# Patient Record
Sex: Male | Born: 1970 | Race: White | Hispanic: No | Marital: Single | Smoking: Never smoker
Health system: Southern US, Community
[De-identification: ages and names within clinical notes are randomized; demographics above are authoritative.]

## PROBLEM LIST (undated history)

## (undated) DIAGNOSIS — I10 Essential (primary) hypertension: Secondary | ICD-10-CM

---

## 2010-09-22 ENCOUNTER — Inpatient Hospital Stay: Payer: Self-pay | Admitting: Internal Medicine

## 2018-06-21 ENCOUNTER — Other Ambulatory Visit: Payer: Self-pay

## 2018-06-21 ENCOUNTER — Encounter: Payer: Self-pay | Admitting: Emergency Medicine

## 2018-06-21 ENCOUNTER — Emergency Department: Payer: No Typology Code available for payment source

## 2018-06-21 ENCOUNTER — Emergency Department
Admission: EM | Admit: 2018-06-21 | Discharge: 2018-06-21 | Disposition: A | Discharge status: Home / Self Care | Payer: No Typology Code available for payment source | Attending: Emergency Medicine | Admitting: Emergency Medicine

## 2018-06-21 DIAGNOSIS — L03211 Cellulitis of face: Secondary | ICD-10-CM | POA: Diagnosis not present

## 2018-06-21 DIAGNOSIS — H02843 Edema of right eye, unspecified eyelid: Secondary | ICD-10-CM | POA: Diagnosis present

## 2018-06-21 DIAGNOSIS — L03213 Periorbital cellulitis: Secondary | ICD-10-CM

## 2018-06-21 HISTORY — DX: Essential (primary) hypertension: I10

## 2018-06-21 LAB — CBC WITH DIFFERENTIAL/PLATELET
BASOS ABS: 0 10*3/uL (ref 0–0.1)
BASOS PCT: 1 %
EOS PCT: 2 %
Eosinophils Absolute: 0.1 10*3/uL (ref 0–0.7)
HCT: 38.8 % — ABNORMAL LOW (ref 40.0–52.0)
Hemoglobin: 13.9 g/dL (ref 13.0–18.0)
LYMPHS PCT: 28 %
Lymphs Abs: 0.8 10*3/uL — ABNORMAL LOW (ref 1.0–3.6)
MCH: 31.6 pg (ref 26.0–34.0)
MCHC: 35.7 g/dL (ref 32.0–36.0)
MCV: 88.3 fL (ref 80.0–100.0)
MONO ABS: 0.3 10*3/uL (ref 0.2–1.0)
Monocytes Relative: 11 %
Neutro Abs: 1.8 10*3/uL (ref 1.4–6.5)
Neutrophils Relative %: 58 %
Platelets: 159 10*3/uL (ref 150–440)
RBC: 4.39 MIL/uL — ABNORMAL LOW (ref 4.40–5.90)
RDW: 13.3 % (ref 11.5–14.5)
WBC: 3 10*3/uL — ABNORMAL LOW (ref 3.8–10.6)

## 2018-06-21 LAB — COMPREHENSIVE METABOLIC PANEL
ALBUMIN: 4 g/dL (ref 3.5–5.0)
ALK PHOS: 66 U/L (ref 38–126)
ALT: 99 U/L — AB (ref 0–44)
AST: 67 U/L — AB (ref 15–41)
Anion gap: 6 (ref 5–15)
BILIRUBIN TOTAL: 1 mg/dL (ref 0.3–1.2)
BUN: 15 mg/dL (ref 6–20)
CALCIUM: 9 mg/dL (ref 8.9–10.3)
CO2: 25 mmol/L (ref 22–32)
CREATININE: 0.78 mg/dL (ref 0.61–1.24)
Chloride: 105 mmol/L (ref 98–111)
GFR calc Af Amer: >60 mL/min (ref >60)
GFR calc non Af Amer: >60 mL/min (ref >60)
GLUCOSE: 356 mg/dL — AB (ref 70–99)
Potassium: 3.9 mmol/L (ref 3.5–5.1)
Sodium: 136 mmol/L (ref 135–145)
TOTAL PROTEIN: 7.3 g/dL (ref 6.5–8.1)

## 2018-06-21 MED ORDER — IOHEXOL 300 MG/ML  SOLN
75.0000 mL | Freq: Once | INTRAMUSCULAR | Status: AC | PRN
  Administered 2018-06-21: 75 mL via INTRAVENOUS

## 2018-06-21 MED ORDER — CEPHALEXIN 500 MG PO CAPS: 500.0000 mg | ORAL_CAPSULE | Freq: Four times a day (QID) | ORAL | 0 refills | Status: AC

## 2018-06-21 MED ORDER — SULFAMETHOXAZOLE-TRIMETHOPRIM 800-160 MG PO TABS: 1.0000 | ORAL_TABLET | Freq: Two times a day (BID) | ORAL | 0 refills | Status: AC

## 2018-06-21 MED ORDER — CEFTRIAXONE SODIUM 1 G IJ SOLR
1.0000 g | Freq: Once | INTRAMUSCULAR | Status: AC
  Administered 2018-06-21: 1 g via INTRAVENOUS
  Filled 2018-06-21: qty 10

## 2018-06-21 NOTE — ED Notes (Signed)
2nd blood culture set attempted x2, 2nd RN to attempt.

## 2018-06-21 NOTE — ED Notes (Signed)
2nd RN x2 unsuccessful attempts at 2nd blood culture. 3rd RN to attempt.

## 2018-06-21 NOTE — ED Triage Notes (Signed)
FIRST NURSE NOTE-sent from eye doctor for r/o periorbital cellulitis.

## 2018-06-21 NOTE — ED Provider Notes (Signed)
[facility] Emergency Department Provider Note ____________________________________________   I have reviewed the triage vital signs and the triage nursing note.  HISTORY  Chief Complaint Eye Problem   Historian Patient  HPI Alexander Martin. is a 47 y.o. male with a history of hypertension presents from the eye doctor, [facility] for evaluation of right periorbital swelling and redness and discomfort.  He rates the discomfort 2 out of 10.  Symptoms started on Thursday when he was working on a a car and would/metal dust came around his eye protection.  He had irritation over the night and saw the eye doctor on Friday.  It sounds like he was started on eye medication on Friday and saw the eye doctor again on Sunday when I became more so swollen especially the right eyelid, and then saw the eye doctor again this morning because the conjunctiva is now severely red as well as significant swelling of the face as well as the right periorbital area.  He shows me that he was put on erythromycin eye ointment as well as Tobra Dex.  He has also been on amoxicillin p.o. since Friday.     Past Medical History:  Diagnosis Date  . Hypertension     There are no active problems to display for this patient.   History reviewed. No pertinent surgical history.  Prior to Admission medications   Medication Sig Start Date End Date Taking? Authorizing Provider  cephALEXin (KEFLEX) 500 MG capsule Take 1 capsule (500 mg total) by mouth 4 (four) times daily for 10 days. 06/21/18 07/01/18  Governor Rooks, MD  sulfamethoxazole-trimethoprim (BACTRIM DS,SEPTRA DS) 800-160 MG tablet Take 1 tablet by mouth 2 (two) times daily. 06/21/18   Governor Rooks, MD    No Known Allergies  History reviewed. No pertinent family history.  Social History Social History   Tobacco Use  . Smoking status: Never Smoker  . Smokeless tobacco: Never Used  Substance Use Topics  . Alcohol use: Yes   Frequency: Never  . Drug use: Never    Review of Systems  Constitutional: Negative for fever. Eyes: Right periorbital redness and swelling, discomfort and some mild blurry vision in the right eye as per HPI. ENT: Negative for sore throat. Cardiovascular: Negative for chest pain. Respiratory: Negative for shortness of breath. Gastrointestinal: Negative for abdominal pain, vomiting and diarrhea. Genitourinary: Negative for dysuria. Musculoskeletal: Negative for back pain. Skin: Negative for rash, some redness to the right periorbital area. Neurological: Negative for headache.  ____________________________________________   PHYSICAL EXAM:  VITAL SIGNS: ED Triage Vitals  Enc Vitals Group     BP 06/21/18 1057 (!) 148/89     Pulse Rate 06/21/18 1057 81     Resp 06/21/18 1057 20     Temp 06/21/18 1057 98.1 F (36.7 C)     Temp Source 06/21/18 1057 Oral     SpO2 06/21/18 1057 96 %     Weight 06/21/18 1054 280 lb (127 kg)     Height 06/21/18 1054 5\' 9"  (1.753 m)     Head Circumference --      Peak Flow --      Pain Score 06/21/18 1054 2     Pain Loc --      Pain Edu? --      Excl. in GC? --      Constitutional: Alert and oriented.  HEENT      Head: Normocephalic and atraumatic.      Eyes: Pupils equal and  round.  Extraocular movements intact.  No pain with extraocular movement.  Conjunctiva bloody/red as well as swollen in the right eye.  Periorbital redness and swelling on the right extending into the face.      Ears:         Nose: No congestion/rhinnorhea.      Mouth/Throat: Mucous membranes are moist.      Neck: No stridor.  Lymph adenopathy on the right. Cardiovascular/Chest: Normal rate, regular rhythm.  No murmurs, rubs, or gallops. Respiratory: Normal respiratory effort without tachypnea nor retractions. Breath sounds are clear and equal bilaterally. No wheezes/rales/rhonchi. Gastrointestinal:   Genitourinary/rectal:Deferred Musculoskeletal: Nontender with normal  range of motion in all extremities.  Neurologic:  Normal speech and language. No gross or focal neurologic deficits are appreciated. Skin:  Skin is warm, dry and intact. No rash noted. Psychiatric: Mood and affect are normal. Speech and behavior are normal. Patient exhibits appropriate insight and judgment.   ____________________________________________  LABS (pertinent positives/negatives) I, Governor Rooks, MD the attending physician have reviewed the labs noted below.  Labs Reviewed  COMPREHENSIVE METABOLIC PANEL - Abnormal; Notable for the following components:      Result Value   Glucose, Bld 356 (*)    AST 67 (*)    ALT 99 (*)    All other components within normal limits  CBC WITH DIFFERENTIAL/PLATELET - Abnormal; Notable for the following components:   WBC 3.0 (*)    RBC 4.39 (*)    HCT 38.8 (*)    Lymphs Abs 0.8 (*)    All other components within normal limits  CULTURE, BLOOD (ROUTINE X 2)  CULTURE, BLOOD (ROUTINE X 2)    ____________________________________________    EKG I, Governor Rooks, MD, the attending physician have personally viewed and interpreted all ECGs.  None ____________________________________________  RADIOLOGY   CT orbits with contrast: Reviewed radiologist report: IMPRESSION: Preseptal cellulitis right orbit. No evidence of orbital cellulitis or abscess.  Bilateral periparotid nodules most likely lymph nodes. Mild cellulitis right lateral face. __________________________________________  PROCEDURES  Procedure(s) performed: None  Procedures  Critical Care performed: None   ____________________________________________  ED COURSE / ASSESSMENT AND PLAN  Pertinent labs & imaging results that were available during my care of the patient were reviewed by me and considered in my medical decision making (see chart for details).    Patient has swelling to his face concerning for cellulitis, question possibility of septal versus preseptal  cellulitis.  We will go ahead and check laboratory studies as well as CT of the orbits to ensure no findings of orbital cellulitis.  We will give a dose of IV Rocephin immediately.  Patient continues to be well-appearing.  Laboratory studies are reassuring.  White blood cell count is 3.0 and I discussed that he will need follow-up.  CT scan shows no evidence of orbital cellulitis.  He does not appear septic, I think is reasonable to try outpatient management with changing antibiotic coverage to Keflex and Bactrim.  We did have a discussion about very close follow-up.     CONSULTATIONS:   None  Patient / Family / Caregiver informed of clinical course, medical decision-making process, and agree with plan.   I discussed return precautions, follow-up instructions, and discharge instructions with patient and/or family.  Discharge Instructions : You are being treated for skin infection called cellulitis with antibiotics Keflex and Bactrim.  Return to the emergency room immediately for any worsening condition including new or worsening pain or swelling, fever, vomiting, altered mental  status or confusion, dizziness or passing out, or any other symptoms concerning to you.      ___________________________________________   FINAL CLINICAL IMPRESSION(S) / ED DIAGNOSES   Final diagnoses:  Facial cellulitis      ___________________________________________         Note: This dictation was prepared with Dragon dictation. Any transcriptional errors that result from this process are unintentional    Governor Rooks, MD 06/21/18 1356

## 2018-06-21 NOTE — Discharge Instructions (Addendum)
You are being treated for skin infection called cellulitis with antibiotics Keflex and Bactrim.  Discontinue Amoxicillin.  Continue your eyedrops.  Return to the emergency room immediately for any worsening condition including new or worsening pain or swelling, fever, vomiting, altered mental status or confusion, dizziness or passing out, or any other symptoms concerning to you.

## 2018-06-21 NOTE — ED Triage Notes (Addendum)
Sent from eye doctor for r/o periorbital cellulitis and CT scan.  Swelling to right periorbital area with vision changes.

## 2018-06-26 LAB — CULTURE, BLOOD (ROUTINE X 2)
CULTURE: NO GROWTH
Culture: NO GROWTH

## 2018-09-12 IMAGING — CT CT ORBITS W/ CM
3 of 4 series · 13 of 47 positions shown, 15 images · IV contrast (omnipaque)
Comparison: CT face 09/22/2010

CLINICAL DATA: Periorbital swelling right eye.  Cellulitis.

EXAM:
CT ORBITS WITH CONTRAST
TECHNIQUE: Multidetector CT images was performed according to the standard
protocol following intravenous contrast administration.
CONTRAST:  75mL OMNIPAQUE IOHEXOL 300 MG/ML  SOLN

[Series 3: orbits 2.0 h30s st · axial · 0.36mm/px · z∈[+270,+356]mm · 8 of 51 slices shown, 10 images]
[im 4/51  brain]
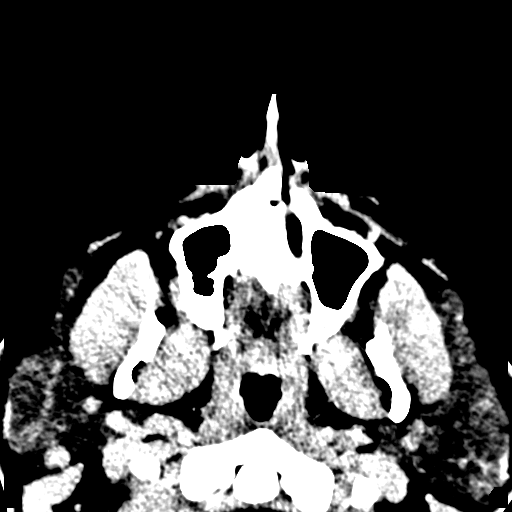
[im 4/51  bone]
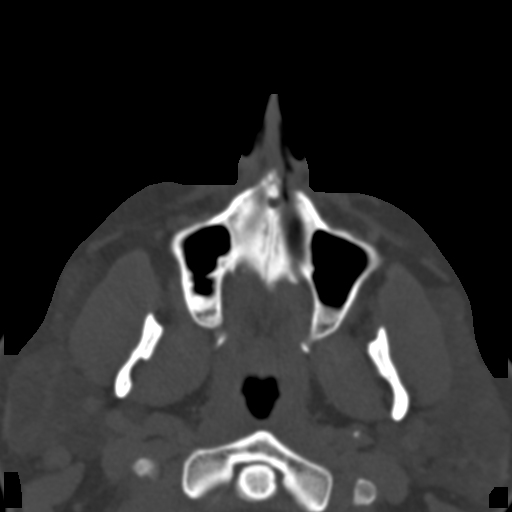
[im 11/51  bone]
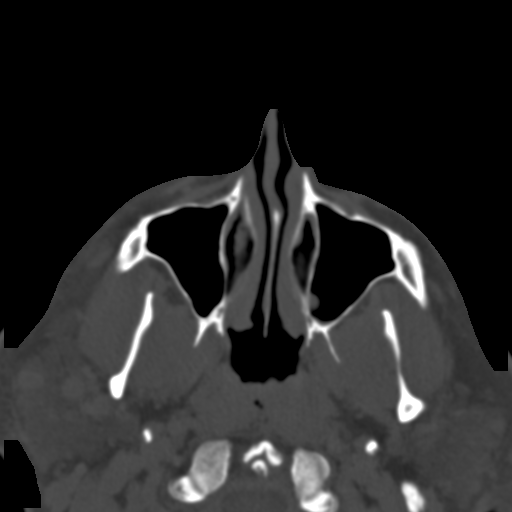
[im 16/51  bone]
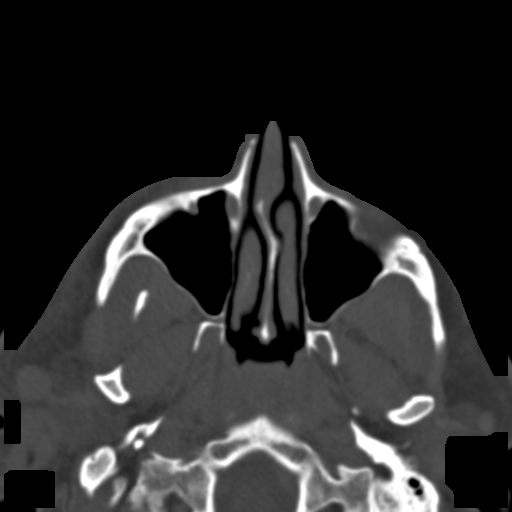
[im 23/51  bone]
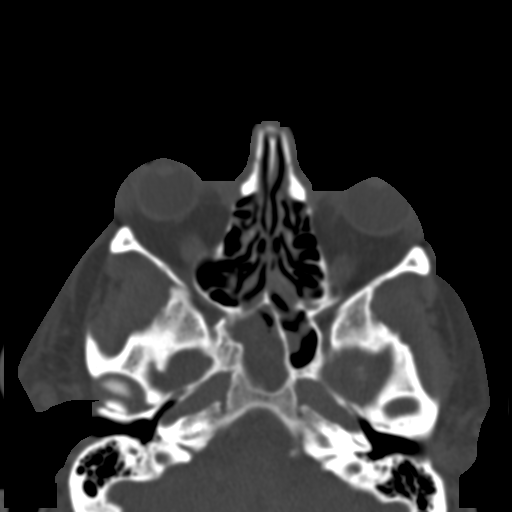
[im 28/51  brain]
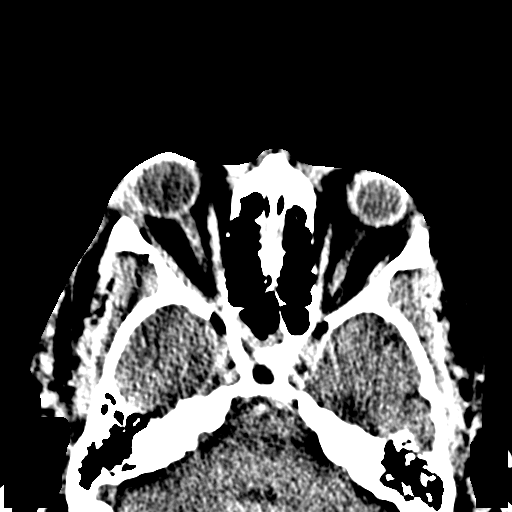
[im 28/51  bone]
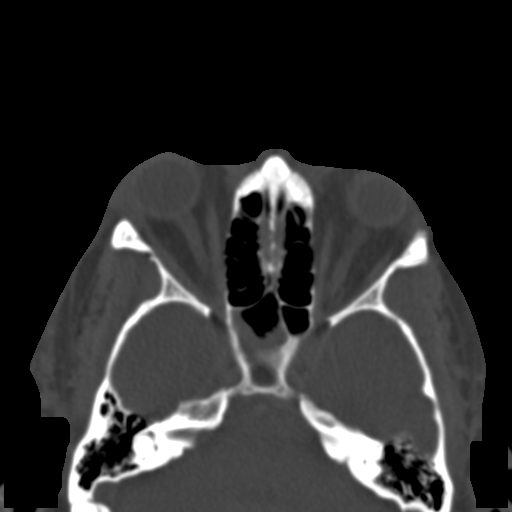
[im 35/51  bone]
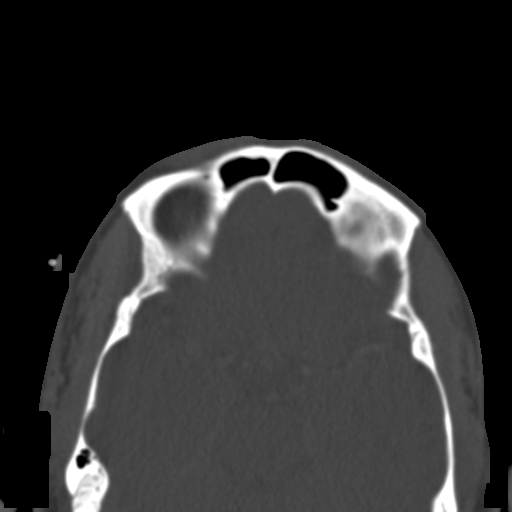
[im 40/51  bone]
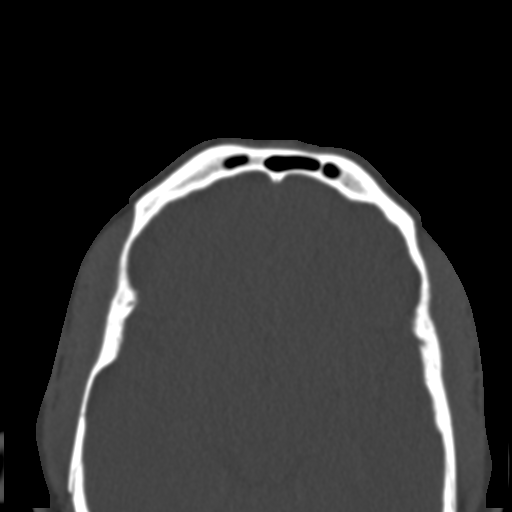
[im 47/51  bone]
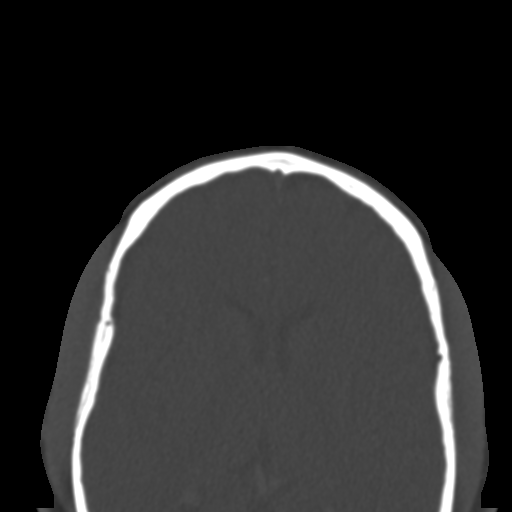

[Series 8: orbits 2.0 coronal · coronal · 0.21mm/px · 3 of 71 slices shown]
[im 24/71  bone]
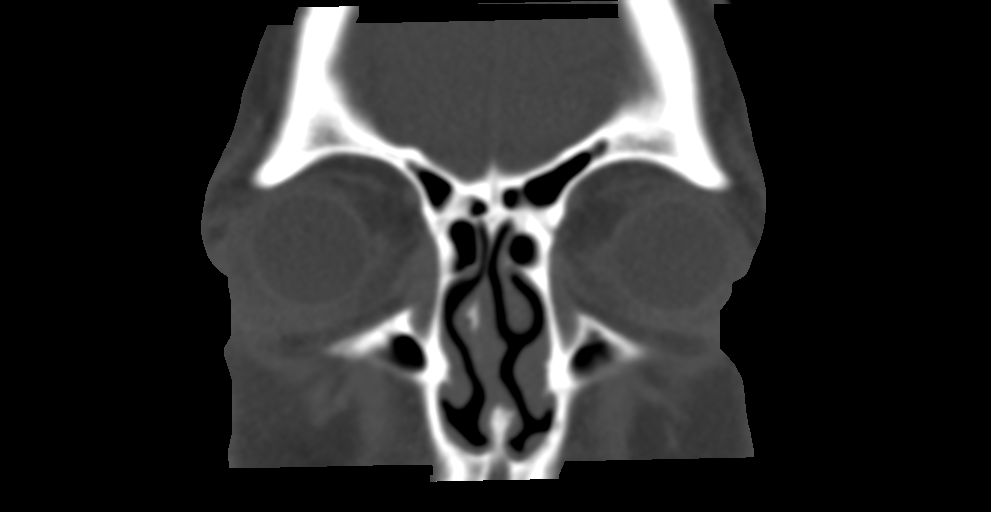
[im 32/71  bone]
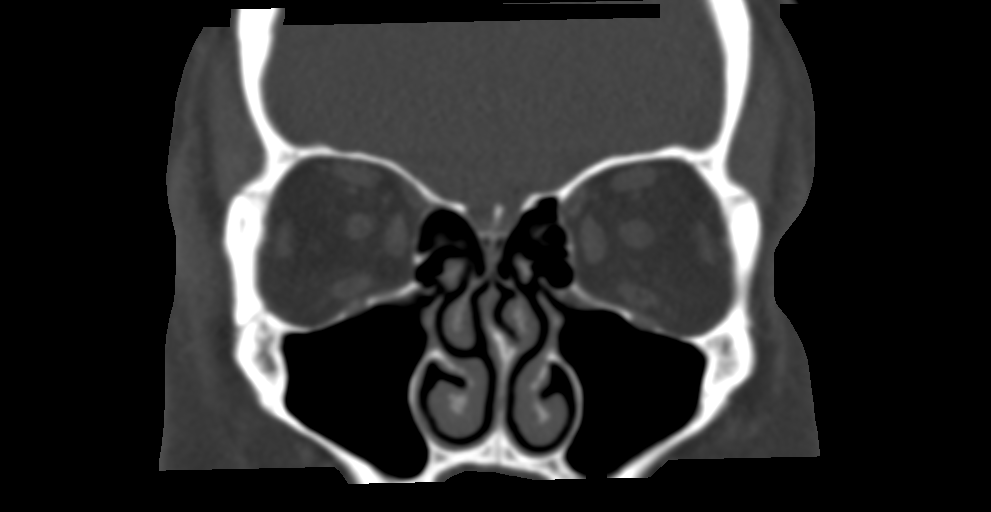
[im 39/71  bone]
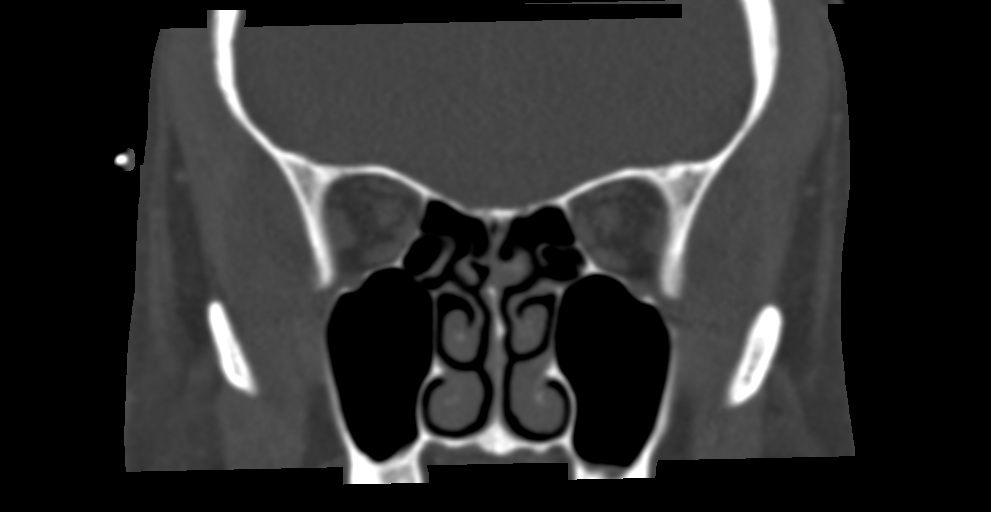

[Series 9: orbits 2.0 sagittal · sagittal · 0.21mm/px · 2 of 105 slices shown]
[im 35/105  bone]
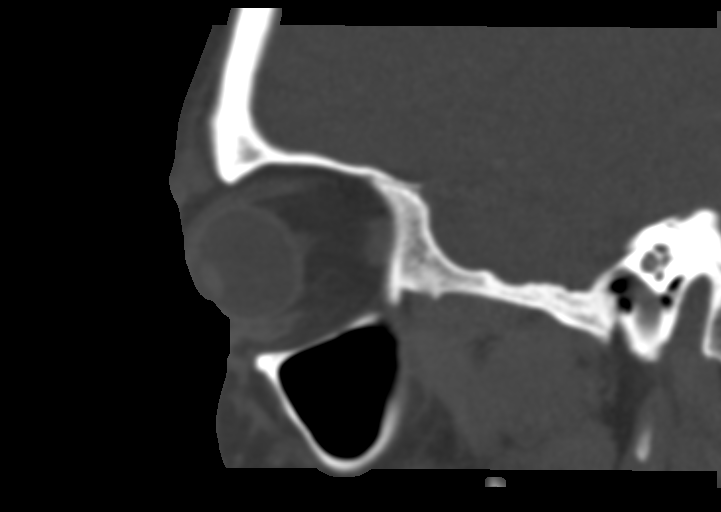
[im 70/105  bone]
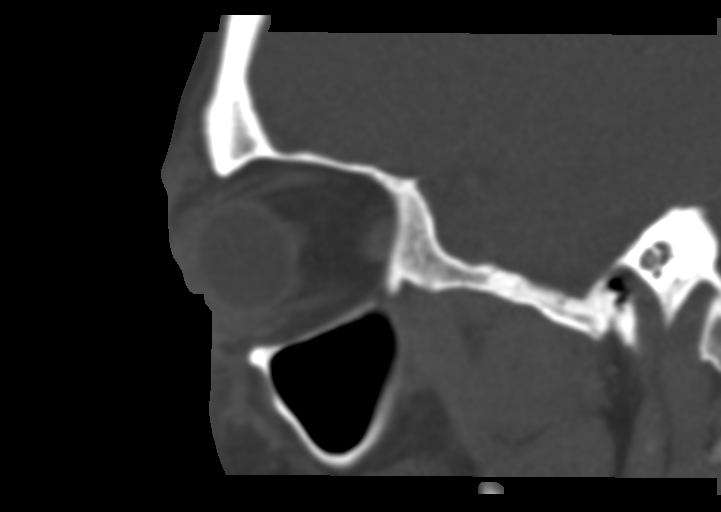

[13 of 47 positions shown; findings below may reference images not displayed]

FINDINGS: Orbits: Preseptal soft tissue swelling right orbit. Enhancing soft
tissue right eyelid compatible with the clinical diagnosis of
cellulitis. No fluid collection or abscess. No orbital abscess.
Negative for orbital cellulitis.

Orbital fat normal. Extraocular muscles and optic nerve normal
bilaterally. Normal globe bilaterally.

Visualized sinuses: Moderate mucosal edema throughout the sphenoid
sinus. Mild mucosal edema left maxillary sinus. No air-fluid levels.

Soft tissues: Periparotid enhancing nodules right greater than left.
Right periparotid nodule 12 mm anteriorly and 12 mm posterior to the
parotid gland. Small 5 mm enhancing nodule around the left parotid.
These are probable lymph nodes. Mild subcutaneous edema in the right
face posterior to the orbit extending back to the external auditory
canal. Probable cellulitis related to orbital cellulitis.

Prior CT demonstrated submandibular duct stones on the left. This
area is not imaged on today's study orbits

Limited intracranial: Negative
IMPRESSION: Preseptal cellulitis right orbit. No evidence of orbital cellulitis
or abscess.

Bilateral periparotid nodules most likely lymph nodes. Mild
cellulitis right lateral face.

## 2019-09-06 ENCOUNTER — Other Ambulatory Visit: Payer: Self-pay

## 2019-09-06 DIAGNOSIS — Z20822 Contact with and (suspected) exposure to covid-19: Secondary | ICD-10-CM

## 2019-09-07 LAB — NOVEL CORONAVIRUS, NAA: SARS-CoV-2, NAA: NOT DETECTED

## 2019-09-16 ENCOUNTER — Telehealth: Payer: No Typology Code available for payment source | Admitting: Family

## 2019-09-16 DIAGNOSIS — R509 Fever, unspecified: Secondary | ICD-10-CM

## 2019-09-16 NOTE — Progress Notes (Signed)
Based on what you shared with me, I feel your condition warrants further evaluation and I recommend that you be seen for a face to face office visit.  Lets see you in person to do some additional testing so we can determine the source of your fever. It may be viral or bacterial. Labs will help clarify what we need to do.    NOTE: If you entered your credit card information for this eVisit, you will not be charged. You may see a "hold" on your card for the $35 but that hold will drop off and you will not have a charge processed.   If you are having a true medical emergency please call 911.      For an urgent face to face visit, [facility] has five urgent care centers for your convenience:      NEW:  [facility] [place] at [place] Get Driving Directions [phone] [place], [place] [address] . 10 am - 6pm Monday - Friday    [place] [facility]) Get Driving Directions [phone] [address] [place], [place] [address] . 10 am to 8 pm Monday-Friday . 12 pm to 8 pm [facility] at [facility] Get Driving Directions [phone] [place], [place], [place] [address] . 8 am to 8 pm Monday-Friday . 9 am to 6 pm Saturday . 11 am to 6 pm Sunday     [facility] at [facility] Get Driving Directions  [phone] [address] [place], [place] [address] . 8 am to 8 pm Monday-Friday . 8 am to 4 pm [facility] at [place] Get Driving Directions [phone] [address] [place], [place] [address] . 12 pm to 6 pm Monday-Friday      Your e-visit answers were reviewed by a board certified advanced clinical practitioner to complete your personal care plan.  Thank you for using e-Visits.

## 2021-12-13 DIAGNOSIS — Z0000 Encounter for general adult medical examination without abnormal findings: Secondary | ICD-10-CM | POA: Diagnosis not present

## 2021-12-13 DIAGNOSIS — Z125 Encounter for screening for malignant neoplasm of prostate: Secondary | ICD-10-CM | POA: Diagnosis not present

## 2022-06-17 ENCOUNTER — Other Ambulatory Visit: Payer: Self-pay | Admitting: Otolaryngology

## 2022-06-17 DIAGNOSIS — K115 Sialolithiasis: Secondary | ICD-10-CM

## 2022-07-01 ENCOUNTER — Ambulatory Visit
Admission: RE | Admit: 2022-07-01 | Discharge: 2022-07-01 | Disposition: A | Discharge status: Home / Self Care | Payer: 59 | Source: Ambulatory Visit | Attending: Otolaryngology | Admitting: Otolaryngology

## 2022-07-01 DIAGNOSIS — K115 Sialolithiasis: Secondary | ICD-10-CM

## 2022-07-01 MED ORDER — IOPAMIDOL (ISOVUE-300) INJECTION 61%
100.0000 mL | Freq: Once | INTRAVENOUS | Status: AC | PRN
  Administered 2022-07-01: 100 mL via INTRAVENOUS
